# Patient Record
Sex: Male | Born: 1985 | Hispanic: Yes | Marital: Married
Health system: Southern US, Community
[De-identification: ages and names within clinical notes are randomized; demographics above are authoritative.]

---

## 2019-12-30 ENCOUNTER — Other Ambulatory Visit: Payer: Self-pay

## 2019-12-30 ENCOUNTER — Emergency Department (HOSPITAL_COMMUNITY): Payer: No Typology Code available for payment source

## 2019-12-30 ENCOUNTER — Emergency Department (HOSPITAL_COMMUNITY)
Admission: EM | Admit: 2019-12-30 | Discharge: 2019-12-31 | Disposition: A | Discharge status: Home / Self Care | Payer: No Typology Code available for payment source | Attending: Emergency Medicine | Admitting: Emergency Medicine

## 2019-12-30 DIAGNOSIS — Y999 Unspecified external cause status: Secondary | ICD-10-CM | POA: Insufficient documentation

## 2019-12-30 DIAGNOSIS — S92535A Nondisplaced fracture of distal phalanx of left lesser toe(s), initial encounter for closed fracture: Secondary | ICD-10-CM | POA: Diagnosis not present

## 2019-12-30 DIAGNOSIS — Y92410 Unspecified street and highway as the place of occurrence of the external cause: Secondary | ICD-10-CM | POA: Diagnosis not present

## 2019-12-30 DIAGNOSIS — M25562 Pain in left knee: Secondary | ICD-10-CM | POA: Insufficient documentation

## 2019-12-30 DIAGNOSIS — M79672 Pain in left foot: Secondary | ICD-10-CM | POA: Diagnosis not present

## 2019-12-30 DIAGNOSIS — Y9389 Activity, other specified: Secondary | ICD-10-CM | POA: Insufficient documentation

## 2019-12-30 DIAGNOSIS — M549 Dorsalgia, unspecified: Secondary | ICD-10-CM | POA: Insufficient documentation

## 2019-12-30 DIAGNOSIS — M542 Cervicalgia: Secondary | ICD-10-CM | POA: Diagnosis present

## 2019-12-30 MED ORDER — CYCLOBENZAPRINE HCL 10 MG PO TABS
10.0000 mg | ORAL_TABLET | Freq: Two times a day (BID) | ORAL | 0 refills | Status: DC | PRN
Start: 2019-12-30 — End: 2019-12-30

## 2019-12-30 MED ORDER — IBUPROFEN 800 MG PO TABS
800.0000 mg | ORAL_TABLET | Freq: Once | ORAL | Status: AC
  Administered 2019-12-30: 800 mg via ORAL
  Filled 2019-12-30: qty 1

## 2019-12-30 MED ORDER — HYDROCODONE-ACETAMINOPHEN 5-325 MG PO TABS: 1.0000 | ORAL_TABLET | Freq: Four times a day (QID) | ORAL | 0 refills | Status: DC | PRN

## 2019-12-30 MED ORDER — IBUPROFEN 600 MG PO TABS: 600.0000 mg | ORAL_TABLET | Freq: Four times a day (QID) | ORAL | 0 refills | Status: DC | PRN
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
## 2019-12-31 MED ORDER — CYCLOBENZAPRINE HCL 10 MG PO TABS: 10.0000 mg | ORAL_TABLET | Freq: Two times a day (BID) | ORAL | 0 refills | Status: AC | PRN

## 2019-12-31 MED ORDER — IBUPROFEN 600 MG PO TABS
600.0000 mg | ORAL_TABLET | Freq: Four times a day (QID) | ORAL | 0 refills | Status: AC | PRN
Start: 2019-12-31 — End: ?

## 2019-12-31 MED ORDER — HYDROCODONE-ACETAMINOPHEN 5-325 MG PO TABS: 1.0000 | ORAL_TABLET | Freq: Four times a day (QID) | ORAL | 0 refills | Status: DC | PRN

## 2019-12-31 MED ORDER — HYDROCODONE-ACETAMINOPHEN 5-325 MG PO TABS: 1.0000 | ORAL_TABLET | Freq: Four times a day (QID) | ORAL | 0 refills | Status: AC | PRN
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
## 2022-09-14 ENCOUNTER — Other Ambulatory Visit: Payer: Self-pay

## 2022-09-14 ENCOUNTER — Emergency Department (HOSPITAL_COMMUNITY)
Admission: EM | Admit: 2022-09-14 | Discharge: 2022-09-14 | Disposition: A | Discharge status: Home / Self Care | Payer: Self-pay | Attending: Emergency Medicine | Admitting: Emergency Medicine

## 2022-09-14 DIAGNOSIS — H1033 Unspecified acute conjunctivitis, bilateral: Secondary | ICD-10-CM | POA: Insufficient documentation

## 2022-09-14 DIAGNOSIS — H5711 Ocular pain, right eye: Secondary | ICD-10-CM

## 2022-09-14 MED ORDER — FLUORESCEIN SODIUM 1 MG OP STRP
1.0000 | ORAL_STRIP | Freq: Once | OPHTHALMIC | Status: AC
  Administered 2022-09-14: 1 via OPHTHALMIC
  Filled 2022-09-14: qty 1

## 2022-09-14 MED ORDER — ERYTHROMYCIN 5 MG/GM OP OINT: TOPICAL_OINTMENT | OPHTHALMIC | 0 refills | Status: AC

## 2022-09-14 MED ORDER — ERYTHROMYCIN 5 MG/GM OP OINT
TOPICAL_OINTMENT | Freq: Once | OPHTHALMIC | Status: AC
  Filled 2022-09-14: qty 3.5

## 2022-09-14 MED ORDER — TETRACAINE HCL 0.5 % OP SOLN
1.0000 [drp] | Freq: Once | OPHTHALMIC | Status: AC
  Administered 2022-09-14: 1 [drp] via OPHTHALMIC
  Filled 2022-09-14: qty 4

## 2022-09-14 NOTE — ED Triage Notes (Signed)
Pt states he was installing insulation in his home this morning. Since then he feels like their is something in his right eye. He has tried flushing his eye without relief.

## 2022-09-14 NOTE — ED Provider Notes (Signed)
[facility] Provider Note   CSN: [id number] Arrival date & time: 09/14/22  1930     History Chief Complaint  Patient presents with   Foreign Body in Eye    HPI Brett Galvan is a 37 y.o. male presenting for chief complaint of right eye pain.  States he was working under his house on fiberglass this morning and after the completion of the work he started having severe eye pain.  Its bilaterally though the right eye is significantly worse in the left eye.  He denies fevers or chills, nausea vomiting, syncope shortness of breath.  He is otherwise ambulatory tolerating p.o. intake.  No known sick contacts.  He has full vision in the eye..   Patient's recorded medical, surgical, social, medication list and allergies were reviewed in the Snapshot window as part of the initial history.   Review of Systems   Review of Systems  Constitutional:  Negative for chills and fever.  HENT:  Negative for ear pain and sore throat.   Eyes:  Positive for pain and redness. Negative for visual disturbance.  Respiratory:  Negative for cough and shortness of breath.   Cardiovascular:  Negative for chest pain and palpitations.  Gastrointestinal:  Negative for abdominal pain and vomiting.  Genitourinary:  Negative for dysuria and hematuria.  Musculoskeletal:  Negative for arthralgias and back pain.  Skin:  Negative for color change and rash.  Neurological:  Negative for seizures and syncope.  All other systems reviewed and are negative.   Physical Exam Updated Vital Signs BP (!) 150/98 (BP Location: Right Arm)   Pulse 78   Temp 98.2 F (36.8 C) (Oral)   Resp 15   Ht 5' 6"$  (1.676 m)   Wt 89.8 kg   SpO2 100%   BMI 31.96 kg/m  Physical Exam Vitals and nursing note reviewed.  Constitutional:      General: He is not in acute distress.    Appearance: He is well-developed.  HENT:     Head: Normocephalic and atraumatic.  Eyes:     Comments: See  below  Cardiovascular:     Rate and Rhythm: Normal rate and regular rhythm.     Heart sounds: No murmur heard. Pulmonary:     Effort: Pulmonary effort is normal. No respiratory distress.     Breath sounds: Normal breath sounds.  Abdominal:     Palpations: Abdomen is soft.     Tenderness: There is no abdominal tenderness.  Musculoskeletal:        General: No swelling.     Cervical back: Neck supple.  Skin:    General: Skin is warm and dry.     Capillary Refill: Capillary refill takes less than 2 seconds.  Neurological:     Mental Status: He is alert.  Psychiatric:        Mood and Affect: Mood normal.      ED Course/ Medical Decision Making/ A&P    Procedures Procedures   Medications Ordered in ED Medications  erythromycin ophthalmic ointment (has no administration in time range)  tetracaine (PONTOCAINE) 0.5 % ophthalmic solution 1 drop (1 drop Left Eye Given 09/14/22 2217)  fluorescein ophthalmic strip 1 strip (1 strip Both Eyes Given 09/14/22 2217)   Medical Decision Making:    Brett Galvan  is a 37 y.o.  who presented to the ED today with visual disturbance detailed above.    This is most consistent with an acute potentially threatening illness  complicated by underlying chronic conditions.  Additional history discussed with patient's family/caregivers.  Patient placed on continuous vitals and telemetry monitoring while in ED which was reviewed periodically.  Complete initial physical exam performed, notably the patient  was hemodynamically stable in no acute distress.    Notably, patient's eye exam is as above   Visual Acuity   L: 20:20  R:20:20   IOP  L:16/95%  R:15/95%   External microscopic L: NAA  R:NAA   Fluoroscein  L: No uptake  R: No uptake   Patient placed onto telemetry monitor while in ED which was reviewed.   Reviewed and confirmed nursing documentation for past medical history, family history, social history.    Assessment:   Patient's  history of present illness and physical exam findings findings do not reveal any acute eye pathology.  Considered orbital cellulitis however patient has no pain with extraocular motions or gross orbital swelling, acute glaucoma however patient's eyes are with normal pressures bilaterally, HSV orbital infection however patient's eyes are without reticular staining pattern, corneal abrasion/ulcer however patient's eyes are without fluorescein uptake or visual corneal abnormality on slit-lamp exam, open globe injury however patient's history of present illness and physical exam findings are grossly inconsistent   Plan:  Ultimately as patient's diagnosis is most consistent with conjunctivitis.  Patient will be treated with erythromycin ointment bilaterally.  Likely a chemical conjunctivitis given his mechanism of working under fiberglass.  I did not identify any corneal abrasion under microscopic nor Woods lamp exam. Light lids were inverted without signs of retained foreign bodies and has been copiously irrigated today.  Symptoms are improved with tetracaine administration. Will plan to treat patient with erythromycin ointment and have patient follow-up with ophthalmology on Monday as needed for reassessment of symptoms.  If improves on conjunctivitis, stable for outpatient care and management Disposition:  I have considered need for hospitalization, however, considering all of the above, I believe this patient is stable for discharge at this time.  Patient/family educated about specific return precautions for given chief complaint and symptoms.  Patient/family educated about follow-up with PCP and OPH.     Patient/family expressed understanding of return precautions and need for follow-up. Patient spoken to regarding all imaging and laboratory results and appropriate follow up for these results. All education provided in verbal form with additional information in written form. Time was allowed for answering  of patient questions. Patient discharged.    Emergency Department Medication Summary:   Medications  erythromycin ophthalmic ointment (has no administration in time range)  tetracaine (PONTOCAINE) 0.5 % ophthalmic solution 1 drop (1 drop Left Eye Given 09/14/22 2217)  fluorescein ophthalmic strip 1 strip (1 strip Both Eyes Given 09/14/22 2217)         Clinical Impression:  1. Acute right eye pain   2. Acute conjunctivitis of both eyes, unspecified acute conjunctivitis type      Discharge   Final Clinical Impression(s) / ED Diagnoses Final diagnoses:  Acute right eye pain  Acute conjunctivitis of both eyes, unspecified acute conjunctivitis type    Rx / DC Orders ED Discharge Orders          Ordered    erythromycin ophthalmic ointment        09/14/22 2256              Tretha Sciara, MD 09/14/22 2334

## 2022-09-14 NOTE — ED Provider Triage Note (Signed)
Emergency Medicine Provider Triage Evaluation Note  Dikembe Ulland , a 37 y.o. male  was evaluated in triage.  Pt complains of possible foreign body in right eye.  Patient was working in the crawl space of his home working) when something fell on his eye.  He is unsure if it was insulation or a piece of old wood.  The visit with the patient states that she tried flushing the eye with a syringe with no relief.  Patient states that it feels something may be moving across the surface of his eye causing pain.  Denies any vision changes at this time  Review of Systems  Positive: As above Negative: As above  Physical Exam  BP (!) 150/98 (BP Location: Right Arm)   Pulse 78   Temp 98.2 F (36.8 C) (Oral)   Resp 15   Ht 5' 6"$  (1.676 m)   Wt 89.8 kg   SpO2 100%   BMI 31.96 kg/m  Gen:   Awake, no distress   Resp:  Normal effort  MSK:   Moves extremities without difficulty  Other:    Medical Decision Making  Medically screening exam initiated at 8:07 PM.  Appropriate orders placed.  Kaedan Diggles was informed that the remainder of the evaluation will be completed by another provider, this initial triage assessment does not replace that evaluation, and the importance of remaining in the ED until their evaluation is complete.     Ronny Bacon 09/14/22 2008
